# Patient Record
Sex: Male | Born: 1975 | Hispanic: Yes | Marital: Married | Smoking: Never smoker
Health system: Southern US, Community
[De-identification: ages and names within clinical notes are randomized; demographics above are authoritative.]

## PROBLEM LIST (undated history)

## (undated) DIAGNOSIS — M5430 Sciatica, unspecified side: Secondary | ICD-10-CM

---

## 2013-06-13 ENCOUNTER — Telehealth: Payer: Self-pay

## 2013-06-13 ENCOUNTER — Ambulatory Visit: Payer: Self-pay | Admitting: Family Medicine

## 2013-06-13 VITALS — BP 112/60 | HR 60 | Temp 98.0°F | Resp 16 | Ht 64.0 in | Wt 162.2 lb

## 2013-06-13 DIAGNOSIS — B86 Scabies: Secondary | ICD-10-CM

## 2013-06-13 MED ORDER — PREDNISONE 20 MG PO TABS: ORAL_TABLET | ORAL | Status: AC

## 2013-06-13 MED ORDER — IVERMECTIN 3 MG PO TABS: 3.0000 mg | ORAL_TABLET | Freq: Once | ORAL | Status: AC

## 2013-06-13 NOTE — Telephone Encounter (Signed)
Left Letter at the front for pt to pick up. Pt aware. Taken out of work until 06/15/13.

## 2013-06-13 NOTE — Patient Instructions (Addendum)
Scabies Scabies are small bugs (mites) that burrow under the skin and cause red bumps and severe itching. These bugs can only be seen with a microscope. Scabies are highly contagious. They can spread easily from person to person by direct contact. They are also spread through sharing clothing or linens that have the scabies mites living in them. It is not unusual for an entire family to become infected through shared towels, clothing, or bedding.  HOME CARE INSTRUCTIONS   Your caregiver may prescribe a cream or lotion to kill the mites. If cream is prescribed, massage the cream into the entire body from the neck to the bottom of both feet. Also massage the cream into the scalp and face if your child is less than 47 year old. Avoid the eyes and mouth. Do not wash your hands after application.  Leave the cream on for 8 to 12 hours. Your child should bathe or shower after the 8 to 12 hour application period. Sometimes it is helpful to apply the cream to your child right before bedtime.  One treatment is usually effective and will eliminate approximately 95% of infestations. For severe cases, your caregiver may decide to repeat the treatment in 1 week. Everyone in your household should be treated with one application of the cream.  New rashes or burrows should not appear within 24 to 48 hours after successful treatment. However, the itching and rash may last for 2 to 4 weeks after successful treatment. Your caregiver may prescribe a medicine to help with the itching or to help the rash go away more quickly.  Scabies can live on clothing or linens for up to 3 days. All of your child's recently used clothing, towels, stuffed toys, and bed linens should be washed in hot water and then dried in a dryer for at least 20 minutes on high heat. Items that cannot be washed should be enclosed in a plastic bag for at least 3 days.  To help relieve itching, bathe your child in a cool bath or apply cool washcloths to the  affected areas.  Your child may return to school after treatment with the prescribed cream. SEEK MEDICAL CARE IF:   The itching persists longer than 4 weeks after treatment.  The rash spreads or becomes infected. Signs of infection include red blisters or yellow-tan crust. Document Released: 11/16/2005 Document Revised: 02/08/2012 Document Reviewed: 03/27/2009 [facility] Patient Information 2014 [place], [place]. Escabiosis (Scabies) La escabiosis son pequeos parsitos (caros) que horadan la piel y causan protuberancias rojas y Tour manager. Estos parsitos slo pueden verse en el microscopio. Son Ronald Morgan contagiosos. Se diseminan fcilmente de [place] persona a otra por contacto directo. Tambin el contagio se produce al compartir prendas de vestir o ropa de cama. No es infrecuente que una familia entera se infecte al compartir toallas, prendas de vestir o ropa de cama.  INSTRUCCIONES PARA EL CUIDADO DOMICILIARIO  El profesional que lo asiste podr prescribirle alguna crema o locin para eliminar los caros. Si se le prescribe, masajee la crema en cada centmetro cuadrado de piel, desde el cuello hasta las plantas de los pies. Tambin aplique la crema en el cuero cabelludo y rostro si se trata de un nio de menos de 1 ao. Evite aplicarla en los ojos y en la boca. No se lave las manos despus de la aplicacin.  Djela durante 8 a 12 horas. El nio podr baarse o darse una ducha despus de 8 a 12 horas de la aplicacin. A veces es til  aplicar la crema justo antes de la hora de dormir.  Generalmente un tratamiento es suficiente y eliminar aproximadamente el 95% de las infecciones. El los casos graves se indicar repetir el tratamiento luego de 1 semana. Todas las personas que habitan en la misma casa deben tratarse con una aplicacin de la crema.  No debern aparecer nuevas erupciones ni galeras luego de las 24 a 48 horas del tratamiento; sin embargo la picazn podra durar de 2 a 4 semanas despus del  tratamiento. ste podr tambin prescribirle un medicamento para ayudarle con la picazn o hacer que desaparezca ms rpidamente.  Estos parsitos pueden vivir en la ropa hasta 3 das. Lave con agua caliente y seque a temperatura elevada durante 20 minutos todas las prendas, toallas, peluches y ropa de cama que el nio haya usado recientemente. Las prendas que no pueden lavarse, debern ser colocadas en una bolsa plstica durante al menos 3 das.  Para aliviar la picazn, dele al nio en un bao de agua fra o aplique paos fros en las zonas afectadas.  El nio podr regresar a la escuela despus del tratamiento con la crema prescripta. SOLICITE ANTENCIN MDICA SI:  La picazn persiste durante ms de 4 semanas despus del tratamiento.  La erupcin se disemina o se infecta. Los signos de infeccin son ampollas rojas o costras de Scientist, forensic. Document Released: 08/26/2005 Document Revised: 02/08/2012 [facility] Patient Information 2014 [place], [place].

## 2013-06-13 NOTE — Telephone Encounter (Signed)
Pt would like to know if he should be out of work since he has been diagnosed with having scabies and if he does need to be out of work he will need a out of work note. Best# [phone]

## 2013-06-13 NOTE — Progress Notes (Signed)
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 He feels that he may have poison ivy because he was working outside at about the time this began. Nevertheless his wife also has a rash very similar which began before the patient's.  Objective patient has tender digital papules, diffuse erythema throughout the entire body consistent with an urticarial reaction. He has multiple papules and vesicles in linear distribution on forearms which are consistent with scabies  Assessment:Scabies - Plan: ivermectin (STROMECTOL) 3 MG TABS, predniSONE (DELTASONE) 20 MG tablet  Signed, Elvina Sidle, MD

## 2015-05-06 ENCOUNTER — Emergency Department (HOSPITAL_COMMUNITY)
Admission: EM | Admit: 2015-05-06 | Discharge: 2015-05-06 | Disposition: A | Discharge status: Home / Self Care | Payer: Self-pay | Attending: Emergency Medicine | Admitting: Emergency Medicine

## 2015-05-06 ENCOUNTER — Encounter (HOSPITAL_COMMUNITY): Payer: Self-pay | Admitting: Emergency Medicine

## 2015-05-06 ENCOUNTER — Emergency Department (HOSPITAL_COMMUNITY): Payer: Self-pay

## 2015-05-06 DIAGNOSIS — Y998 Other external cause status: Secondary | ICD-10-CM | POA: Insufficient documentation

## 2015-05-06 DIAGNOSIS — S8011XA Contusion of right lower leg, initial encounter: Secondary | ICD-10-CM | POA: Insufficient documentation

## 2015-05-06 DIAGNOSIS — T1490XA Injury, unspecified, initial encounter: Secondary | ICD-10-CM

## 2015-05-06 DIAGNOSIS — Y929 Unspecified place or not applicable: Secondary | ICD-10-CM | POA: Insufficient documentation

## 2015-05-06 DIAGNOSIS — Z23 Encounter for immunization: Secondary | ICD-10-CM | POA: Insufficient documentation

## 2015-05-06 DIAGNOSIS — S81841A Puncture wound with foreign body, right lower leg, initial encounter: Secondary | ICD-10-CM | POA: Insufficient documentation

## 2015-05-06 DIAGNOSIS — Z8739 Personal history of other diseases of the musculoskeletal system and connective tissue: Secondary | ICD-10-CM | POA: Insufficient documentation

## 2015-05-06 DIAGNOSIS — W228XXA Striking against or struck by other objects, initial encounter: Secondary | ICD-10-CM | POA: Insufficient documentation

## 2015-05-06 DIAGNOSIS — Y9389 Activity, other specified: Secondary | ICD-10-CM | POA: Insufficient documentation

## 2015-05-06 DIAGNOSIS — S80851A Superficial foreign body, right lower leg, initial encounter: Secondary | ICD-10-CM

## 2015-05-06 HISTORY — DX: Sciatica, unspecified side: M54.30

## 2015-05-06 MED ORDER — LIDOCAINE HCL (PF) 1 % IJ SOLN
5.0000 mL | Freq: Once | INTRAMUSCULAR | Status: AC
  Administered 2015-05-06: 5 mL
  Filled 2015-05-06: qty 5

## 2015-05-06 MED ORDER — TETANUS-DIPHTH-ACELL PERTUSSIS 5-2.5-18.5 LF-MCG/0.5 IM SUSP
0.5000 mL | Freq: Once | INTRAMUSCULAR | Status: AC
  Administered 2015-05-06: 0.5 mL via INTRAMUSCULAR
  Filled 2015-05-06: qty 0.5

## 2015-05-06 MED ORDER — BACITRACIN ZINC 500 UNIT/GM EX OINT
1.0000 "application " | TOPICAL_OINTMENT | Freq: Two times a day (BID) | CUTANEOUS | Status: DC
  Administered 2015-05-06: 1 via TOPICAL
  Filled 2015-05-06: qty 28.35

## 2015-05-06 NOTE — ED Provider Notes (Signed)
CSN: [id number]     Arrival date & time 05/06/15  2129 History   This chart was scribed for Kerrie Buffalo, NP working with Vanetta Mulders, MD by Evon Slack, ED Scribe. This patient was seen in room TR05C/TR05C and the patient's care was started at 10:01 PM.     Chief Complaint  Patient presents with  . Leg Injury   Patient is a 39 y.o. male presenting with foot injury. The history is provided by the patient. No language interpreter was used.  Foot Injury Location:  Leg Time since incident:  11 hours Injury: yes   Mechanism of injury comment:  Direct blow Leg location:  R leg and R lower leg Pain details:    Radiates to:  Does not radiate   Severity:  Mild   Onset quality:  Sudden   Duration:  11 hours Chronicity:  New Relieved by:  None tried Worsened by:  Nothing tried Ineffective treatments:  None tried Associated symptoms: no fever, no numbness, no swelling and no tingling    HPI Comments: Ronald Morgan is a 39 y.o. male who presents to the Emergency Department complaining of new right lower leg injury to his shin onset today at 11 AM. Pt states that he accidentally hit himself with a hammer on the shin. Pt presents with small wound on shin. Pt denies any medication PTA. Pt states that he is unsure of tetanus. Pt doesn't report numbness or tingling.    Past Medical History  Diagnosis Date  . Sciatica    History reviewed. No pertinent past surgical history. No family history on file. History  Substance Use Topics  . Smoking status: Never Smoker   . Smokeless tobacco: Not on file  . Alcohol Use: Yes    Review of Systems  Constitutional: Negative for fever.  Musculoskeletal:       Right lower leg pain and wound  Skin: Positive for wound.  Neurological: Negative for numbness.  All other systems reviewed and are negative.     Allergies  Review of patient's allergies indicates no known allergies.  Home Medications   Prior to Admission medications    Medication Sig Start Date End Date Taking? Authorizing Provider  ivermectin (STROMECTOL) 3 MG TABS Take 1 tablet (3 mg total) by mouth once. 06/13/13   Elvina Sidle, MD  predniSONE (DELTASONE) 20 MG tablet 2 daily with food 06/13/13   Elvina Sidle, MD   BP 131/76 mmHg  Pulse 67  Temp(Src) 98.4 F (36.9 C) (Oral)  Resp 18  SpO2 98%   Physical Exam  Constitutional: He is oriented to person, place, and time. He appears well-developed and well-nourished. No distress.  HENT:  Head: Normocephalic and atraumatic.  Eyes: Conjunctivae and EOM are normal.  Neck: Neck supple. No tracheal deviation present.  Cardiovascular: Normal rate.   Pulmonary/Chest: Effort normal. No respiratory distress.  Musculoskeletal: Normal range of motion. He exhibits tenderness.       Legs: Small puncture wound to the right lower leg tender to palpation 2+ distal pulses and adequate circulation.   Neurological: He is alert and oriented to person, place, and time.  Skin: Skin is warm and dry.  Psychiatric: He has a normal mood and affect. His behavior is normal.  Nursing note and vitals reviewed.   ED Course  FOREIGN BODY REMOVAL Date/Time: 05/06/2015 11:09 PM Performed by: Janne Napoleon Authorized by: Janne Napoleon Consent: Verbal consent obtained. Risks and benefits: risks, benefits and alternatives were discussed Consent  given by: patient Patient understanding: patient states understanding of the procedure being performed Required items: required blood products, implants, devices, and special equipment available Body area: skin General location: lower extremity Location details: right lower leg Anesthesia: local infiltration Local anesthetic: lidocaine 1% without epinephrine Patient sedated: no Patient restrained: no Removal mechanism: forceps Dressing: antibiotic ointment and dressing applied Tendon involvement: none Depth: subcutaneous Complexity: simple 1 objects recovered. Objects  recovered: ? wood Post-procedure assessment: foreign body removed Patient tolerance: Patient tolerated the procedure well with no immediate complications   (including critical care time)   DIAGNOSTIC STUDIES: Oxygen Saturation is 98% on RA, normal by my interpretation.    COORDINATION OF CARE: 10:08 PM-Discussed treatment plan with pt at bedside and pt agreed to plan.   Imaging Review Dg Tibia/fibula Right  05/06/2015   CLINICAL DATA:  Hit by hammer. Pain.  EXAM: RIGHT TIBIA AND FIBULA - 2 VIEW  COMPARISON:  None.  FINDINGS: No fracture is evident. There is soft tissue swelling anteriorly over the mid tibia. Small radiopaque density could represent hematoma, or foreign body.  IMPRESSION: Soft tissue swelling without osseous injury. Cannot exclude small radiopaque foreign body anteriorly.   Electronically Signed   By: Davonna BellingJohn  Curnes M.D.   On: 05/06/2015 22:42     MDM  39 y.o. male with puncture wound to the right lower leg after he accidentally hit his leg with a hammer. Stable for d/c. Discussed with the patient and all questioned fully answered. He will return if any problems arise.  Final diagnoses:  Trauma  Contusion of right leg, initial encounter  Foreign body of right lower leg, initial encounter   I personally performed the services described in this documentation, which was scribed in my presence. The recorded information has been reviewed and is accurate.     [address]Hope AbingdonM Ariyanna Oien, [place]NP 05/06/15 2315  Vanetta MuldersScott Zackowski, MD 05/08/15 [phone]

## 2015-05-06 NOTE — ED Notes (Signed)
Pt. accidentally hit his right shin with a hammer this evening , presenta with small puncture wound with minimal bleeding .

## 2016-03-16 IMAGING — DX DG TIBIA/FIBULA 2V*R*
2 series · 2 of 2 positions shown · non-contrast
Comparison: None.

CLINICAL DATA: Hit by O[place]. Pain.

EXAM:
RIGHT TIBIA AND FIBULA - 2 VIEW

[tibia ap]
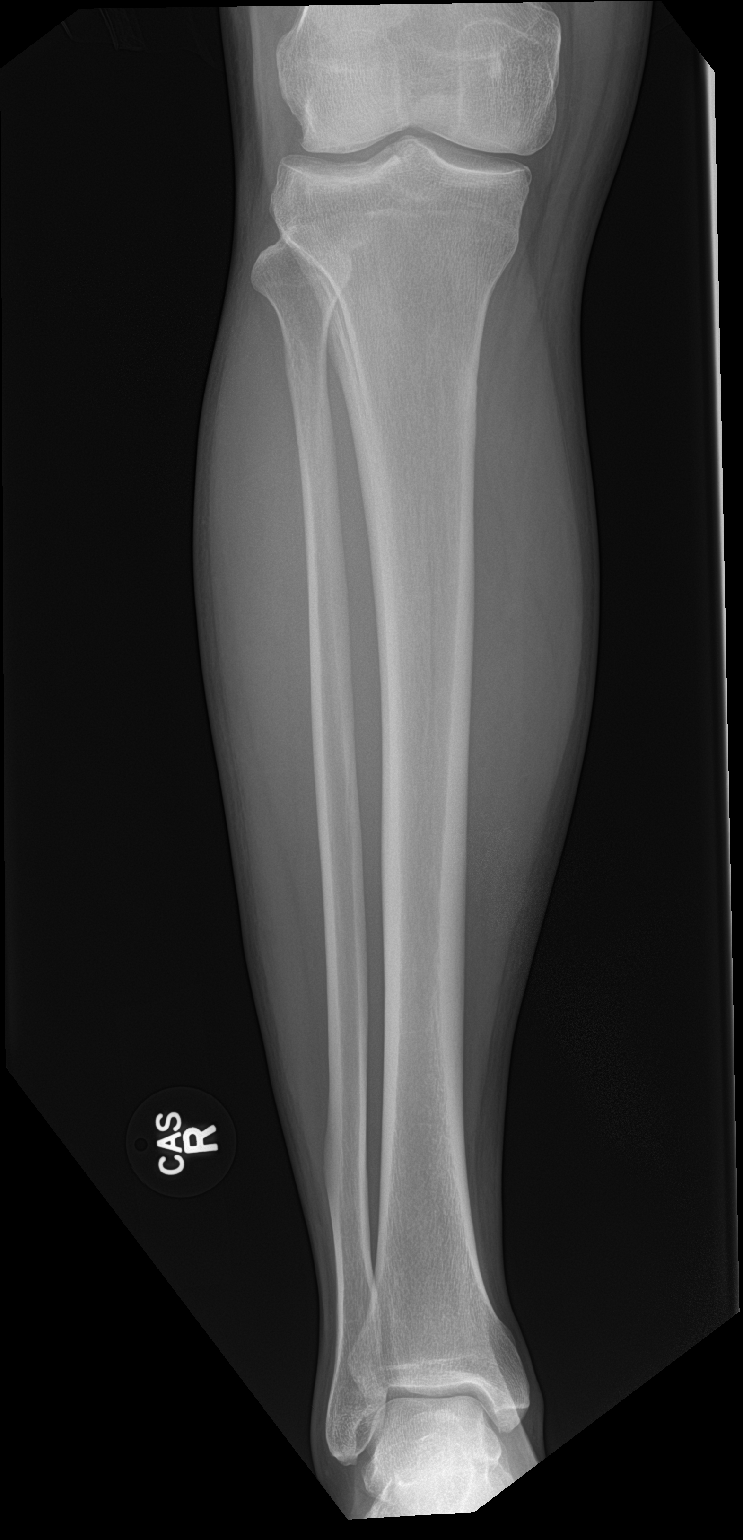

[tibia lat]
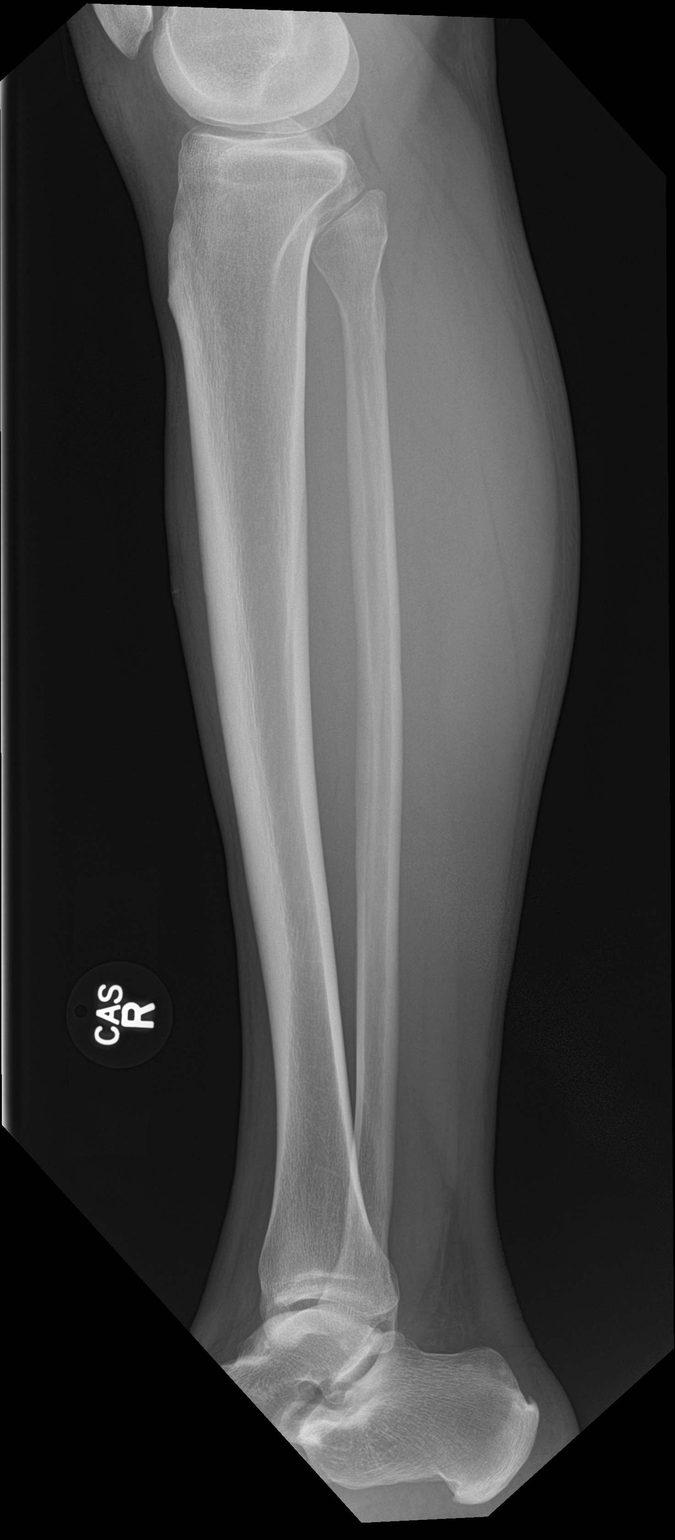

[2 of 2 positions shown; findings below may reference images not displayed]

FINDINGS: No fracture is evident. There is soft tissue swelling anteriorly
over the mid tibia. Small radiopaque density could represent
hematoma, or foreign body.
IMPRESSION: Soft tissue swelling without osseous injury. Cannot exclude small
radiopaque foreign body anteriorly.
